# Patient Record
Sex: Female | Born: 1965 | Race: Black or African American | Hispanic: No | Marital: Married | Smoking: Never smoker
Health system: Southern US, Community
[De-identification: ages and names within clinical notes are randomized; demographics above are authoritative.]

## PROBLEM LIST (undated history)

## (undated) DIAGNOSIS — C801 Malignant (primary) neoplasm, unspecified: Secondary | ICD-10-CM

## (undated) DIAGNOSIS — J45909 Unspecified asthma, uncomplicated: Secondary | ICD-10-CM

## (undated) DIAGNOSIS — E079 Disorder of thyroid, unspecified: Secondary | ICD-10-CM

## (undated) DIAGNOSIS — K5090 Crohn's disease, unspecified, without complications: Secondary | ICD-10-CM

## (undated) DIAGNOSIS — R51 Headache: Secondary | ICD-10-CM

## (undated) DIAGNOSIS — D496 Neoplasm of unspecified behavior of brain: Secondary | ICD-10-CM

## (undated) DIAGNOSIS — D869 Sarcoidosis, unspecified: Secondary | ICD-10-CM

## (undated) DIAGNOSIS — R519 Headache, unspecified: Secondary | ICD-10-CM

## (undated) HISTORY — PX: LUNG BIOPSY: SHX232

## (undated) HISTORY — PX: THYROIDECTOMY: SHX17

## (undated) HISTORY — PX: CHOLECYSTECTOMY: SHX55

---

## 2016-09-16 ENCOUNTER — Encounter (HOSPITAL_COMMUNITY): Payer: Self-pay | Admitting: Emergency Medicine

## 2016-09-16 ENCOUNTER — Emergency Department (HOSPITAL_COMMUNITY): Payer: Medicare HMO

## 2016-09-16 DIAGNOSIS — G4489 Other headache syndrome: Secondary | ICD-10-CM | POA: Diagnosis not present

## 2016-09-16 DIAGNOSIS — J45909 Unspecified asthma, uncomplicated: Secondary | ICD-10-CM | POA: Insufficient documentation

## 2016-09-16 DIAGNOSIS — D649 Anemia, unspecified: Secondary | ICD-10-CM | POA: Diagnosis not present

## 2016-09-16 DIAGNOSIS — K5090 Crohn's disease, unspecified, without complications: Secondary | ICD-10-CM | POA: Diagnosis not present

## 2016-09-16 DIAGNOSIS — R51 Headache: Secondary | ICD-10-CM | POA: Diagnosis present

## 2016-09-16 DIAGNOSIS — Z9049 Acquired absence of other specified parts of digestive tract: Secondary | ICD-10-CM | POA: Insufficient documentation

## 2016-09-16 LAB — CBC
HCT: 34.1 % — ABNORMAL LOW (ref 36.0–46.0)
Hemoglobin: 10.8 g/dL — ABNORMAL LOW (ref 12.0–15.0)
MCH: 25.3 pg — AB (ref 26.0–34.0)
MCHC: 31.7 g/dL (ref 30.0–36.0)
MCV: 79.9 fL (ref 78.0–100.0)
PLATELETS: 279 10*3/uL (ref 150–400)
RBC: 4.27 MIL/uL (ref 3.87–5.11)
RDW: 15.8 % — AB (ref 11.5–15.5)
WBC: 6.8 10*3/uL (ref 4.0–10.5)

## 2016-09-16 LAB — URINALYSIS, ROUTINE W REFLEX MICROSCOPIC
Bilirubin Urine: NEGATIVE
Glucose, UA: NEGATIVE mg/dL
KETONES UR: NEGATIVE mg/dL
Leukocytes, UA: NEGATIVE
Nitrite: NEGATIVE
PH: 5 (ref 5.0–8.0)
PROTEIN: 100 mg/dL — AB
Specific Gravity, Urine: 1.025 (ref 1.005–1.030)
WBC, UA: NONE SEEN WBC/hpf (ref 0–5)

## 2016-09-16 LAB — BASIC METABOLIC PANEL
Anion gap: 9 (ref 5–15)
BUN: 8 mg/dL (ref 6–20)
CHLORIDE: 104 mmol/L (ref 101–111)
CO2: 23 mmol/L (ref 22–32)
CREATININE: 0.84 mg/dL (ref 0.44–1.00)
Calcium: 9.1 mg/dL (ref 8.9–10.3)
GFR calc Af Amer: >60 mL/min (ref >60)
GFR calc non Af Amer: >60 mL/min (ref >60)
Glucose, Bld: 120 mg/dL — ABNORMAL HIGH (ref 65–99)
Potassium: 3.6 mmol/L (ref 3.5–5.1)
Sodium: 136 mmol/L (ref 135–145)

## 2016-09-16 NOTE — ED Triage Notes (Addendum)
Pt transported from home by [facility], pt refused [facility]. Pt c/o HA, unclear when HA began.  Per EMS pt screaming nearly entire way to ED. IV est by EMS #20 AC. Pt states she moved to area from [place].  Pt states she has been to [facility] and does not want to back d/t lack of cleanliness  Pt states she has a brain tumor that does not show up on MRI or CT, only PET d/t location. Pt states last scan 1 yr ago in [place] Pt speaking in a very child like tone, pt reports HA x 2 hours, pt states she is out of meds, thyroid meds and HA pills. Pt falling asleep during triage conversation, pt states she is tired after moving into new apartment today.

## 2016-09-17 ENCOUNTER — Emergency Department (HOSPITAL_COMMUNITY)
Admission: EM | Admit: 2016-09-17 | Discharge: 2016-09-17 | Disposition: A | Discharge status: Home / Self Care | Payer: Medicare HMO | Attending: Emergency Medicine | Admitting: Emergency Medicine

## 2016-09-17 DIAGNOSIS — D649 Anemia, unspecified: Secondary | ICD-10-CM

## 2016-09-17 DIAGNOSIS — G4489 Other headache syndrome: Secondary | ICD-10-CM

## 2016-09-17 HISTORY — DX: Neoplasm of unspecified behavior of brain: D49.6

## 2016-09-17 HISTORY — DX: Disorder of thyroid, unspecified: E07.9

## 2016-09-17 HISTORY — DX: Unspecified asthma, uncomplicated: J45.909

## 2016-09-17 HISTORY — DX: Headache, unspecified: R51.9

## 2016-09-17 HISTORY — DX: Crohn's disease, unspecified, without complications: K50.90

## 2016-09-17 HISTORY — DX: Headache: R51

## 2016-09-17 HISTORY — DX: Sarcoidosis, unspecified: D86.9

## 2016-09-17 MED ORDER — LORAZEPAM 1 MG PO TABS
1.0000 mg | ORAL_TABLET | Freq: Once | ORAL | Status: AC
  Administered 2016-09-17: 1 mg via ORAL
  Filled 2016-09-17: qty 1

## 2016-09-17 MED ORDER — LORAZEPAM 1 MG PO TABS: 1.0000 mg | ORAL_TABLET | Freq: Three times a day (TID) | ORAL | 0 refills | Status: AC | PRN

## 2016-09-17 NOTE — ED Provider Notes (Signed)
[facility] DEPT Provider Note   CSN: [id number] Arrival date & time: 09/16/16  2053     History   Chief Complaint Chief Complaint  Patient presents with  . Headache    HPI Claudia Torres is a 51 y.o. female.  She presents for evaluation of ongoing headache for 4 months that she thinks is related to living in an apartment "with mold."  She moved out of the apartment today.  She complains of ongoing rhinorrhea, but denies fever, chills, nausea, persistent vomiting, chest pain, productive cough.  She did vomit once tonight.  She occasionally coughs but does not produce sputum.  She has previously taken Klonopin, but ran out of it 3 weeks ago.  She is relatively new to [facility] and has not established primary care doctor yet.  She feels like the home with mold caused her teeth to "fall out and a bald patch on my head."  There are no other known modifying factors.    HPI  Past Medical History:  Diagnosis Date  . Asthma   . Brain tumor ([place])   . Crohn disease ([place])   . Headache   . Sarcoidosis   . Thyroid disease     There are no active problems to display for this patient.   Past Surgical History:  Procedure Laterality Date  . CHOLECYSTECTOMY    . LUNG BIOPSY    . THYROIDECTOMY      OB History    No data available       Home Medications    Prior to Admission medications   Not on File    Family History No family history on file.  Social History Social History  Substance Use Topics  . Smoking status: Never Smoker  . Smokeless tobacco: Never Used  . Alcohol use No     Allergies   Penicillins and Reglan [metoclopramide]   Review of Systems Review of Systems  All other systems reviewed and are negative.    Physical Exam Updated Vital Signs BP 117/84   Pulse 79   Temp 99 F (37.2 C) (Oral)   Resp 16   Ht 5\' 7"  (1.702 m)   Wt 90.7 kg (200 lb)   LMP 09/15/2016   SpO2 98%   BMI 31.32 kg/m   Physical Exam  Constitutional: She is  oriented to person, place, and time. She appears well-developed and well-nourished. No distress.  HENT:  Head: Normocephalic and atraumatic.  Poor dentition with multiple caries  Eyes: Pupils are equal, round, and reactive to light. Conjunctivae and EOM are normal.  Neck: Normal range of motion and phonation normal. Neck supple.  No meningismus  Cardiovascular: Normal rate and regular rhythm.   Pulmonary/Chest: Effort normal and breath sounds normal. She exhibits no tenderness.  Musculoskeletal: Normal range of motion.  Mild tenderness of the posterior cervical and upper trapezius muscles bilaterally.  Neurological: She is alert and oriented to person, place, and time. She exhibits normal muscle tone.  Skin: Skin is warm and dry.  Psychiatric: Her behavior is normal. Judgment and thought content normal.  Mildly anxious  Nursing note and vitals reviewed.    ED Treatments / Results  Labs (all labs ordered are listed, but only abnormal results are displayed) Labs Reviewed  BASIC METABOLIC PANEL - Abnormal; Notable for the following:       Result Value   Glucose, Bld 120 (*)    All other components within normal limits  CBC - Abnormal; Notable for the following:  Hemoglobin 10.8 (*)    HCT 34.1 (*)    MCH 25.3 (*)    RDW 15.8 (*)    All other components within normal limits  URINALYSIS, ROUTINE W REFLEX MICROSCOPIC - Abnormal; Notable for the following:    APPearance HAZY (*)    Hgb urine dipstick LARGE (*)    Protein, ur 100 (*)    Bacteria, UA RARE (*)    Squamous Epithelial / LPF 0-5 (*)    All other components within normal limits  CBG MONITORING, ED    EKG  EKG Interpretation  Date/Time:  Friday September 16 2016 21:13:03 EDT Ventricular Rate:  89 PR Interval:  156 QRS Duration: 84 QT Interval:  368 QTC Calculation: 447 R Axis:   79 Text Interpretation:  Normal sinus rhythm Normal ECG No old tracing to compare Confirmed by Daleen Bo [phone]) on 09/17/2016  1:23:47 AM       Radiology Ct Head Wo Contrast  Result Date: 09/16/2016 CLINICAL DATA:  Headache, altered level of consciousness EXAM: CT HEAD WITHOUT CONTRAST TECHNIQUE: Contiguous axial images were obtained from the base of the skull through the vertex without intravenous contrast. COMPARISON:  None. FINDINGS: Brain: No evidence of acute infarction, hemorrhage, hydrocephalus, extra-axial collection or mass lesion/mass effect. Vascular: No hyperdense vessel or unexpected calcification. Skull: Normal. Negative for fracture or focal lesion. Sinuses/Orbits: Mild mucosal thickening in the ethmoid sinuses. No acute orbital abnormality. Other: None IMPRESSION: No CT evidence for acute intracranial abnormality. Electronically Signed   By: Donavan Foil M.D.   On: 09/16/2016 21:39    Procedures Procedures (including critical care time)  Medications Ordered in ED Medications - No data to display   Initial Impression / Assessment and Plan / ED Course  I have reviewed the triage vital signs and the nursing notes.  Pertinent labs & imaging results that were available during my care of the patient were reviewed by me and considered in my medical decision making (see chart for details).      Patient Vitals for the past 24 hrs:  BP Temp Temp src Pulse Resp SpO2 Height Weight  09/16/16 2348 117/84 - - 79 16 98 % - -  09/16/16 2103 - - - - - - 5\' 7"  (1.702 m) 90.7 kg (200 lb)  09/16/16 2059 111/81 99 F (37.2 C) Oral 99 16 100 % - -  09/16/16 2053 - - - - - 96 % - -    1:35 AM Reevaluation with update and discussion. After initial assessment and treatment, an updated evaluation reveals no change in clinical status.  Findings discussed with patient, all questions answered. [name]      Final Clinical Impressions(s) / ED Diagnoses   Final diagnoses:  Other headache syndrome  Anemia, unspecified type    Nonspecific headache, likely muscle tension related.  Patient concern for  "mild illness."  No sign of acute pulmonary, intracranial, or metabolic instability.  Nursing Notes Reviewed/ Care Coordinated Applicable Imaging Reviewed Interpretation of Laboratory Data incorporated into ED treatment  The patient appears reasonably screened and/or stabilized for discharge and I doubt any other medical condition or other [facility] requiring further screening, evaluation, or treatment in the ED at this time prior to discharge.  Plan: Home Medications-OTC analgesia as needed; Home Treatments-rest, fluids; return here if the recommended treatment, does not improve the symptoms; Recommended follow up-follow-up with PCP and dentist of choice as soon as possible.   New Prescriptions New Prescriptions   No medications on  file     Daleen Bo, MD 09/17/16 [phone]

## 2016-09-17 NOTE — Discharge Instructions (Signed)
Make sure you are drinking plenty of water.  Follow-up with a primary care doctor, and Claudia Torres, as soon as possible for ongoing care.  Try using heat on the sore area of your neck, to help the discomfort.  Your hemoglobin was low today, 10.8.  Follow-up with a primary care doctor about it in 1 or 2 weeks.

## 2016-11-23 ENCOUNTER — Emergency Department (HOSPITAL_COMMUNITY)
Admission: EM | Admit: 2016-11-23 | Discharge: 2016-11-23 | Disposition: A | Discharge status: Home / Self Care | Payer: Medicare HMO | Attending: Emergency Medicine | Admitting: Emergency Medicine

## 2016-11-23 ENCOUNTER — Encounter (HOSPITAL_COMMUNITY): Payer: Self-pay | Admitting: Emergency Medicine

## 2016-11-23 ENCOUNTER — Emergency Department (HOSPITAL_COMMUNITY): Payer: Medicare HMO

## 2016-11-23 ENCOUNTER — Other Ambulatory Visit: Payer: Self-pay

## 2016-11-23 DIAGNOSIS — J45901 Unspecified asthma with (acute) exacerbation: Secondary | ICD-10-CM | POA: Insufficient documentation

## 2016-11-23 DIAGNOSIS — Z9049 Acquired absence of other specified parts of digestive tract: Secondary | ICD-10-CM | POA: Diagnosis not present

## 2016-11-23 DIAGNOSIS — R05 Cough: Secondary | ICD-10-CM

## 2016-11-23 DIAGNOSIS — Z79899 Other long term (current) drug therapy: Secondary | ICD-10-CM | POA: Insufficient documentation

## 2016-11-23 DIAGNOSIS — R059 Cough, unspecified: Secondary | ICD-10-CM

## 2016-11-23 HISTORY — DX: Malignant (primary) neoplasm, unspecified: C80.1

## 2016-11-23 LAB — I-STAT BETA HCG BLOOD, ED (MC, WL, AP ONLY)

## 2016-11-23 LAB — COMPREHENSIVE METABOLIC PANEL
ALK PHOS: 106 U/L (ref 38–126)
ALT: 8 U/L — AB (ref 14–54)
AST: 12 U/L — AB (ref 15–41)
Albumin: 2.9 g/dL — ABNORMAL LOW (ref 3.5–5.0)
Anion gap: 7 (ref 5–15)
BILIRUBIN TOTAL: 0.5 mg/dL (ref 0.3–1.2)
BUN: 8 mg/dL (ref 6–20)
CALCIUM: 9 mg/dL (ref 8.9–10.3)
CO2: 23 mmol/L (ref 22–32)
CREATININE: 0.82 mg/dL (ref 0.44–1.00)
Chloride: 107 mmol/L (ref 101–111)
GFR calc Af Amer: >60 mL/min (ref >60)
GFR calc non Af Amer: >60 mL/min (ref >60)
Glucose, Bld: 131 mg/dL — ABNORMAL HIGH (ref 65–99)
POTASSIUM: 3.4 mmol/L — AB (ref 3.5–5.1)
Sodium: 137 mmol/L (ref 135–145)
TOTAL PROTEIN: 6.8 g/dL (ref 6.5–8.1)

## 2016-11-23 LAB — CBC WITH DIFFERENTIAL/PLATELET
BASOS ABS: 0 10*3/uL (ref 0.0–0.1)
BASOS PCT: 1 %
EOS ABS: 0.8 10*3/uL — AB (ref 0.0–0.7)
EOS PCT: 12 %
HCT: 33.2 % — ABNORMAL LOW (ref 36.0–46.0)
Hemoglobin: 10.5 g/dL — ABNORMAL LOW (ref 12.0–15.0)
Lymphocytes Relative: 32 %
Lymphs Abs: 2 10*3/uL (ref 0.7–4.0)
MCH: 25.2 pg — ABNORMAL LOW (ref 26.0–34.0)
MCHC: 31.6 g/dL (ref 30.0–36.0)
MCV: 79.8 fL (ref 78.0–100.0)
MONO ABS: 0.3 10*3/uL (ref 0.1–1.0)
Monocytes Relative: 6 %
Neutro Abs: 3.1 10*3/uL (ref 1.7–7.7)
Neutrophils Relative %: 49 %
PLATELETS: 310 10*3/uL (ref 150–400)
RBC: 4.16 MIL/uL (ref 3.87–5.11)
RDW: 16.8 % — AB (ref 11.5–15.5)
WBC: 6.2 10*3/uL (ref 4.0–10.5)

## 2016-11-23 LAB — I-STAT TROPONIN, ED
TROPONIN I, POC: 0 ng/mL (ref 0.00–0.08)
Troponin i, poc: 0 ng/mL (ref 0.00–0.08)

## 2016-11-23 LAB — I-STAT CG4 LACTIC ACID, ED
LACTIC ACID, VENOUS: 1.06 mmol/L (ref 0.5–1.9)
Lactic Acid, Venous: 1.29 mmol/L (ref 0.5–1.9)

## 2016-11-23 MED ORDER — IOPAMIDOL (ISOVUE-370) INJECTION 76%
INTRAVENOUS | Status: AC
  Administered 2016-11-23: 100 mL
  Filled 2016-11-23: qty 100

## 2016-11-23 MED ORDER — PREDNISONE 50 MG PO TABS: 50.0000 mg | ORAL_TABLET | Freq: Every day | ORAL | 0 refills | Status: AC

## 2016-11-23 MED ORDER — METHYLPREDNISOLONE SODIUM SUCC 125 MG IJ SOLR
125.0000 mg | Freq: Once | INTRAMUSCULAR | Status: AC
  Administered 2016-11-23: 125 mg via INTRAVENOUS
  Filled 2016-11-23: qty 2

## 2016-11-23 MED ORDER — HYDROXYZINE HCL 25 MG PO TABS: 25.0000 mg | ORAL_TABLET | Freq: Three times a day (TID) | ORAL | 0 refills | Status: AC | PRN

## 2016-11-23 MED ORDER — ALBUTEROL SULFATE (2.5 MG/3ML) 0.083% IN NEBU
5.0000 mg | INHALATION_SOLUTION | Freq: Once | RESPIRATORY_TRACT | Status: AC
  Administered 2016-11-23: 5 mg via RESPIRATORY_TRACT
  Filled 2016-11-23: qty 6

## 2016-11-23 NOTE — ED Triage Notes (Signed)
Pt to ED for recurring cough, SOB x 1 month. Pt reports taking a course of antibiotics in the last month following dental work, but was not put on antibiotics for current symptoms. Pt has tried inhaler, OTC meds with some relief, but she states the cough comes right back. Her husband at bedside reports she has become increasingly weak over the last week. She reports fevers off and on. Pt has hx of brain cancer, sts she hasn't seen her doctor in a while, states "I think I have lung cancer." Pt saw someone last week, who told her they saw "lines on her lungs, but could have been related to surgery in the past." Bilateral wheezing noted. Pt ambulatory with steady gait. Resp e/u, skin warm/dry at this time.

## 2016-11-23 NOTE — ED Notes (Signed)
Patient transported to CT 

## 2016-11-23 NOTE — Discharge Instructions (Signed)
Your workup today showed no evidence of pneumonia or blood clot causing your cough and shortness of breath.  We suspect to have an asthma exacerbation causing her symptoms and wheezing.  Please take the steroids for the next few days.  Please continue your home breathing treatments.  Please follow-up with a primary care physician for further med if any symptoms change or worsen, please return to the nearest emergency department.

## 2016-11-23 NOTE — ED Provider Notes (Signed)
[facility] EMERGENCY DEPARTMENT Provider Note   CSN: [id number] Arrival date & time: 11/23/16  1130     History   Chief Complaint Chief Complaint  Patient presents with  . Cough  . Fatigue    HPI Tynlee Bayle is a 51 y.o. female.  The history is provided by the patient and medical records.  Cough  This is a new problem. The current episode started more than 1 week ago. The problem occurs constantly. The problem has not changed since onset.The cough is productive of blood-tinged sputum. Maximum temperature: subejctive fevers. Associated symptoms include chest pain, chills, rhinorrhea, shortness of breath and wheezing. Pertinent negatives include no sweats and no headaches. She has tried nothing for the symptoms. The treatment provided no relief. She is not a smoker. Her past medical history is significant for asthma.    Past Medical History:  Diagnosis Date  . Asthma   . Brain tumor ([place])   . Crohn disease ([place])   . Headache   . Sarcoidosis   . Thyroid disease     There are no active problems to display for this patient.   Past Surgical History:  Procedure Laterality Date  . CHOLECYSTECTOMY    . LUNG BIOPSY    . THYROIDECTOMY      OB History    No data available       Home Medications    Prior to Admission medications   Medication Sig Start Date End Date Taking? Authorizing Provider  albuterol (PROVENTIL HFA;VENTOLIN HFA) 108 (90 Base) MCG/ACT inhaler Inhale 1-2 puffs every 6 (six) hours as needed into the lungs for wheezing.  10/26/16  Yes [provider]  albuterol (PROVENTIL) (2.5 MG/3ML) 0.083% nebulizer solution Inhale 3 mLs every 6 (six) hours as needed into the lungs. 08/25/16  Yes [provider]  Fluticasone-Salmeterol (ADVAIR DISKUS) 250-50 MCG/DOSE AEPB Inhale 1 puff 2 (two) times daily into the lungs. 11/17/16  Yes [provider]  levothyroxine (SYNTHROID, LEVOTHROID) 125 MCG tablet Take 125 mcg daily  by mouth. 11/12/16  Yes [provider]  LORazepam (ATIVAN) 1 MG tablet Take 1 tablet (1 mg total) by mouth every 8 (eight) hours as needed (Muscle tension). 09/17/16   Daleen Bo, MD    Family History No family history on file.  Social History Social History   Tobacco Use  . Smoking status: Never Smoker  . Smokeless tobacco: Never Used  Substance Use Topics  . Alcohol use: No  . Drug use: No     Allergies   Eggs or egg-derived products; Other; Orange fruit [citrus]; Penicillins; Reglan [metoclopramide]; and Tomato   Review of Systems Review of Systems  Constitutional: Positive for chills, fatigue and fever. Negative for diaphoresis.  HENT: Positive for congestion and rhinorrhea.   Eyes: Negative for visual disturbance.  Respiratory: Positive for cough, chest tightness, shortness of breath and wheezing.   Cardiovascular: Positive for chest pain.  Gastrointestinal: Negative for abdominal pain, constipation, diarrhea, nausea and vomiting.  Genitourinary: Negative for decreased urine volume, dysuria, flank pain and frequency.  Musculoskeletal: Negative for back pain and neck stiffness.  Skin: Negative for rash and wound.  Neurological: Negative for light-headedness, numbness and headaches.  Psychiatric/Behavioral: Negative for agitation and confusion.  All other systems reviewed and are negative.    Physical Exam Updated Vital Signs BP (!) 132/93   Pulse 76   Temp 98 F (36.7 C) (Oral)   Resp 18   Ht 5\' 7"  (1.702 m)  Wt 79.4 kg (175 lb)   LMP 11/02/2016   SpO2 99%   BMI 27.41 kg/m   Physical Exam  Constitutional: She appears well-developed and well-nourished. No distress.  HENT:  Head: Normocephalic and atraumatic.  Mouth/Throat: Oropharynx is clear and moist. No oropharyngeal exudate.  Eyes: Conjunctivae and EOM are normal. Pupils are equal, round, and reactive to light.  Neck: Neck supple. No JVD present.  Cardiovascular: Normal rate, regular  rhythm and intact distal pulses.  No murmur heard. Pulmonary/Chest: Effort normal. Tachypnea noted. No respiratory distress. She has wheezes. She has no rhonchi. She has no rales. She exhibits tenderness.  Abdominal: Soft. There is no tenderness.  Musculoskeletal: She exhibits no edema or tenderness.  Neurological: She is alert. No sensory deficit. She exhibits normal muscle tone.  Skin: Skin is warm and dry. Capillary refill takes less than 2 seconds. No rash noted. She is not diaphoretic. No erythema.  Psychiatric: She has a normal mood and affect.  Nursing note and vitals reviewed.    ED Treatments / Results  Labs (all labs ordered are listed, but only abnormal results are displayed) Labs Reviewed  COMPREHENSIVE METABOLIC PANEL - Abnormal; Notable for the following components:      Result Value   Potassium 3.4 (*)    Glucose, Bld 131 (*)    Albumin 2.9 (*)    AST 12 (*)    ALT 8 (*)    All other components within normal limits  CBC WITH DIFFERENTIAL/PLATELET - Abnormal; Notable for the following components:   Hemoglobin 10.5 (*)    HCT 33.2 (*)    MCH 25.2 (*)    RDW 16.8 (*)    Eosinophils Absolute 0.8 (*)    All other components within normal limits  I-STAT CG4 LACTIC ACID, ED  I-STAT BETA HCG BLOOD, ED ([facility], [facility], AP ONLY)  I-STAT CG4 LACTIC ACID, ED  I-STAT TROPONIN, ED  I-STAT TROPONIN, ED    EKG  EKG Interpretation  Date/Time:  Wednesday November 23 2016 11:56:35 EST Ventricular Rate:  93 PR Interval:  140 QRS Duration: 84 QT Interval:  378 QTC Calculation: 469 R Axis:   69 Text Interpretation:  Normal sinus rhythm Normal ECG Confirmed by Quintella Reichert [phone]) on 11/23/2016 12:05:53 PM       Radiology Dg Chest 2 View  Result Date: 11/23/2016 CLINICAL DATA:  Chest pain x1 month, cough, shortness of breath EXAM: CHEST  2 VIEW COMPARISON:  12/03/2016 FINDINGS: Left upper lobe/ perihilar scarring. 3 mm right lower lobe granuloma. No focal consolidation. No  pleural effusion or pneumothorax. The heart normal size. Visualized osseous structures are within normal limits. Cholecystectomy clips. IMPRESSION: No evidence of acute cardiopulmonary disease. Electronically Signed   By: Julian Hy M.D.   On: 11/23/2016 12:18   Ct Angio Chest Pe W And/or Wo Contrast  Result Date: 11/23/2016 CLINICAL DATA:  Chest pain, cough for 2 weeks. History of sarcoidosis, brain tumor, lung biopsy, thyroidectomy. EXAM: CT ANGIOGRAPHY CHEST WITH CONTRAST TECHNIQUE: Multidetector CT imaging of the chest was performed using the standard protocol during bolus administration of intravenous contrast. Multiplanar CT image reconstructions and MIPs were obtained to evaluate the vascular anatomy. CONTRAST:  61.5 cc Isovue 370 COMPARISON:  Chest radiograph November 23, 2016 at 1216 hours FINDINGS: CARDIOVASCULAR: Adequate contrast opacification of the pulmonary artery's. Main pulmonary artery is not enlarged. No pulmonary arterial filling defects to the level of the segmental branches. Mild respiratory motion, limits sensitivity for subsegmental emboli. Heart size is  normal, no right heart strain. No pericardial effusion. Thoracic aorta is normal course and caliber, mild calcific atherosclerosis aortic arch. MEDIASTINUM/NODES: 10 mm short axis aortopulmonary window lymph node. 14 mm subcarinal lymph node. Anterior mediastinal fat stranding most compatible with thymic reactivation. LUNGS/PLEURA: Tracheobronchial tree is patent, no pneumothorax. Mild bronchial wall thickening. LEFT upper lobe nodular scarring with bronchiectasis, 4 mm dense versus enhancing nodular component. RIGHT lung base atelectasis or scarring. UPPER ABDOMEN: Nonacute.  Status post cholecystectomy. MUSCULOSKELETAL: Visualized soft tissues and included osseous structures appear normal. Review of the MIP images confirms the above findings. IMPRESSION: 1. No acute pulmonary embolism. 2. Bronchial wall thickening seen with  bronchitis or reactive airway disease without focal consolidation. 3. LEFT upper lobe scarring suggests post infectious in etiology. 4. Mild mediastinal lymphadenopathy, associated with sarcoidosis. Electronically Signed   By: Elon Alas M.D.   On: 11/23/2016 19:24    Procedures Procedures (including critical care time)  Medications Ordered in ED Medications  albuterol (PROVENTIL) (2.5 MG/3ML) 0.083% nebulizer solution 5 mg (5 mg Nebulization Given 11/23/16 1653)  iopamidol (ISOVUE-370) 76 % injection (100 mLs  Contrast Given 11/23/16 1838)  methylPREDNISolone sodium succinate (SOLU-MEDROL) 125 mg/2 mL injection 125 mg (125 mg Intravenous Given 11/23/16 2135)     Initial Impression / Assessment and Plan / ED Course  I have reviewed the triage vital signs and the nursing notes.  Pertinent labs & imaging results that were available during my care of the patient were reviewed by me and considered in my medical decision making (see chart for details).     Paulena Servais is a 51 y.o. female with a past medical history significant for asthma, thyroid disease, sarcoidosis, Crohn's disease, and prior brain tumor who presents with hemoptysis, shortness of breath, wheezing, congestion, productive cough, sharp chest pain, and fatigue.  Patient reports that since she moved to [place] several months ago she has been having worsening asthma.  She says that she had a worsening cough with chills for the last month.  One month ago she says that she was started on steroids and azithromycin which did not clear up her symptoms.  She says that she has continued symptoms that has progressed to occasional hemoptysis.  She says that she is wheezing all the time that her albuterol is not helping at home.  She reports that she has sharp chest pain in her central chest when she has coughing fits.  She denies leg pain or leg swelling.  She denies history of blood clots.  She denies any nausea, vomiting,  palpitations, syncope.  She denies any urinary symptoms or GI symptoms.  Patient denies any trauma.  On exam, patient has wheezing all albuterol nebulizer treatment.  Patient had no significant leg swelling or pain.  No leg tenderness.  Normal pulses in upper extremities.  Chest was mildly tender in the sternum.  Abdomen was nontender.  Back nontender.  Based on patient's symptoms, suspect asthma exacerbation in the setting of irritants.  Patient reports that she was exposed to mold at her previous residence and since moving the asthma has gotten slightly better.  She is still worried about infection.  Patient's other primary concern today is for lung cancer.  Given the coughing of blood, this is her biggest concern today.  Given the hemoptysis, occasional chest pain, shortness of breath in the setting of wheezing, CT PE study will be ordered to evaluate for pulmonary embolism as well as evaluate for possible malignancies.    Initial  EKG showed no STEMI.  Patient will have screening laboratory testing.  Heart score calculated as a 1.  Anticipate reassessment after workup.  9:21 PM Diagnostic testing results and above.  Patient had negative troponin x2.  Given the low heart score, patient is felt low risk for major adverse cardiac event in the next 30 days.  Lactic acid negative x2.  Laboratory testing otherwise reassuring.  PET scan showed no evidence of pulmonary embolism.  Patient has evidence of scar in the lungs which she reports is from prior pneumonias.  No evidence of cancer but sarcoidosis was seen.  Patient had improvement in her wheezing after the breathing treatment.  Suspect patient has an asthma exacerbation leading to her symptoms.  Patient will be given Solu-Medrol as well as a prescription for several days of a steroid.  Patient reports that she gets tingling in her lips occasionally and was asking for prescription of Atarax.  This was ordered.  Patient will follow-up with her  primary care physician in the next few days as well as good return precautions for any new or worsening symptoms.  Patient reports that she is moving to [place] to get away from the cold.  Patient understood return precautions and had no other questions or concerns.  Patient discharged in good condition.   Final Clinical Impressions(s) / ED Diagnoses   Final diagnoses:  Moderate asthma with exacerbation, unspecified whether persistent  Cough    ED Discharge Orders        Ordered    hydrOXYzine (ATARAX/VISTARIL) 25 MG tablet  Every 8 hours PRN     11/23/16 2116    predniSONE (DELTASONE) 50 MG tablet  Daily     11/23/16 2116      Clinical Impression: 1. Moderate asthma with exacerbation, unspecified whether persistent   2. Cough     Disposition: Discharge  Condition: Good  I have discussed the results, Dx and Tx plan with the pt(& family if present). He/she/they expressed understanding and agree(s) with the plan. Discharge instructions discussed at great length. Strict return precautions discussed and pt &/or family have verbalized understanding of the instructions. No further questions at time of discharge.    This SmartLink is deprecated. Use AVSMEDLIST instead to display the medication list for a patient.  Follow Up: [place] [id number] [phone] Schedule an appointment as soon as possible for a visit    [place] [address] [id number] [place] [phone]  If symptoms worsen     Tomiko Schoon, Gwenyth Allegra, MD 11/23/16 2314

## 2016-11-23 NOTE — ED Notes (Signed)
ED Provider at bedside. 

## 2018-08-08 IMAGING — DX DG CHEST 2V
2 series · 2 of 2 positions shown · non-contrast
Comparison: 12/03/2016

CLINICAL DATA: Chest pain x1 month, cough, shortness of breath

EXAM:
CHEST  2 VIEW

[w chest lat]
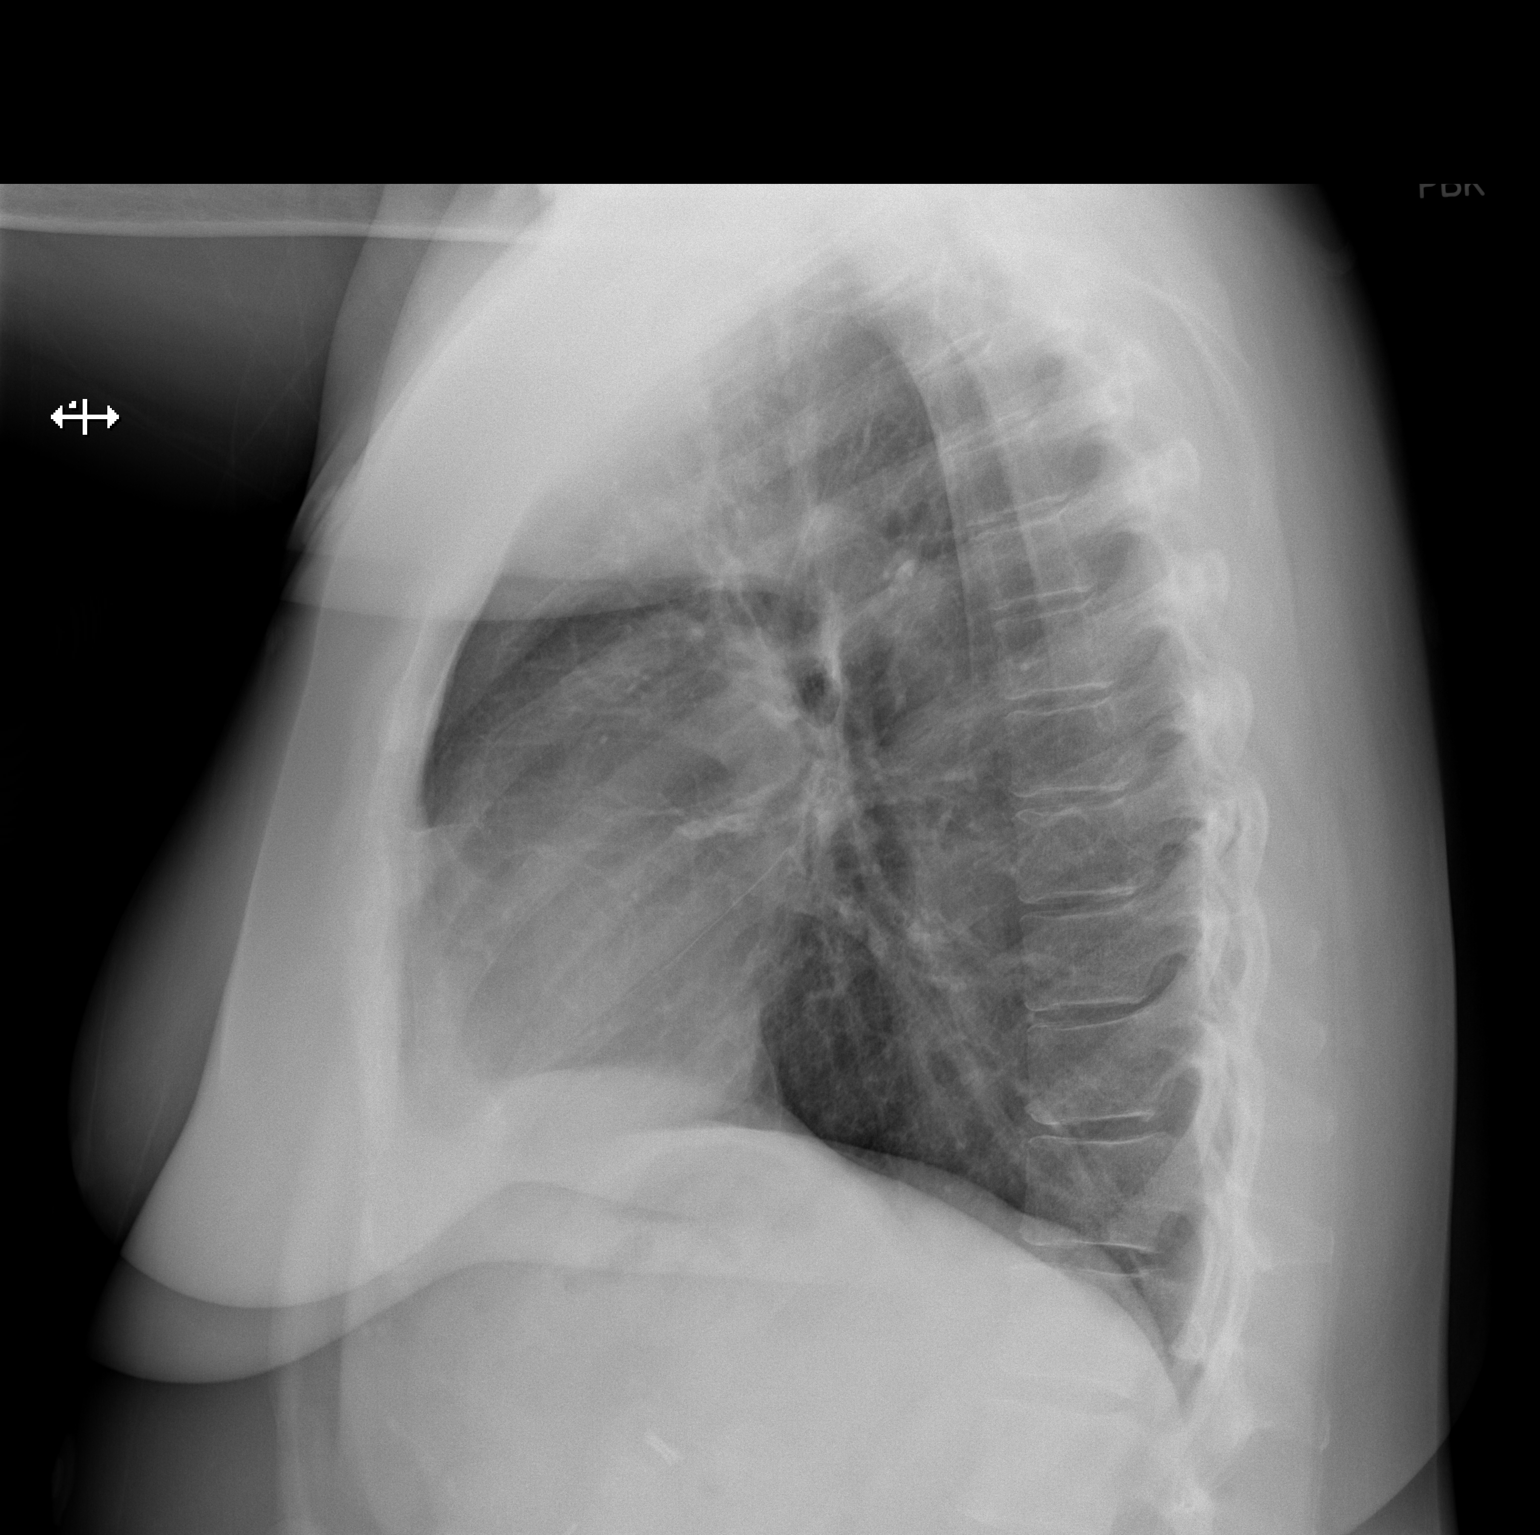

[w chest pa]
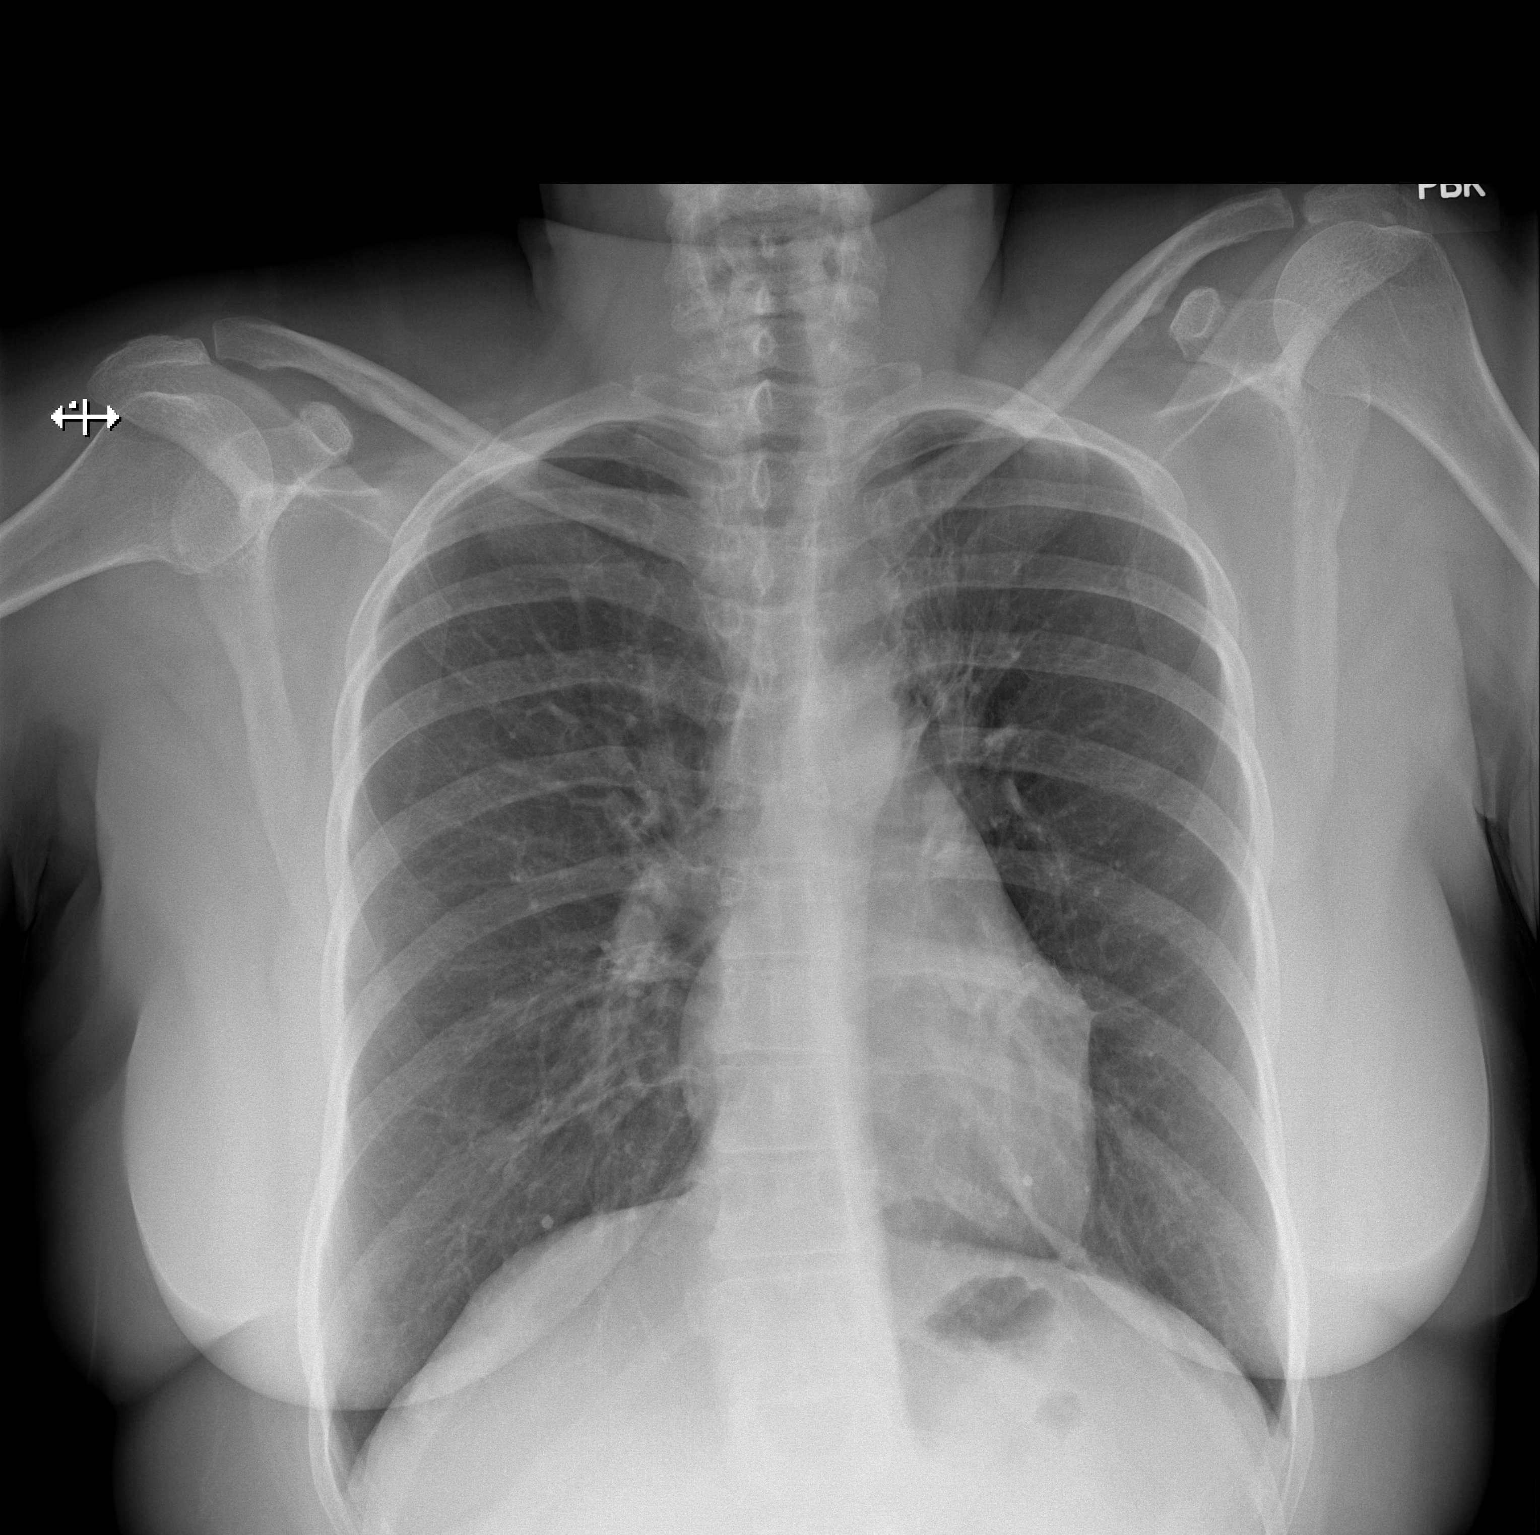

[2 of 2 positions shown; findings below may reference images not displayed]

FINDINGS: Left upper lobe/ perihilar scarring. 3 mm right lower lobe
granuloma. No focal consolidation. No pleural effusion or
pneumothorax.

The heart normal size.

Visualized osseous structures are within normal limits.

Cholecystectomy clips.
IMPRESSION: No evidence of acute cardiopulmonary disease.

## 2018-08-08 IMAGING — CT CT ANGIO CHEST
2 of 7 series · 18 of 46 positions shown · IV contrast (APPLIED)
Comparison: Chest radiograph November 23, 2016 at 4046 hours

CLINICAL DATA: Chest pain, cough for 2 weeks. History of
sarcoidosis, brain tumor, lung biopsy, thyroidectomy.

EXAM:
CT ANGIOGRAPHY CHEST WITH CONTRAST
TECHNIQUE: Multidetector CT imaging of the chest was performed using the
standard protocol during bolus administration of intravenous
contrast. Multiplanar CT image reconstructions and MIPs were
obtained to evaluate the vascular anatomy.
CONTRAST:  61.5 cc Isovue 370

[Series 8: thins · axial · 0.63mm/px · z∈[-219,+19]mm · 15 of 383 slices shown]
[im 22/383  lung]
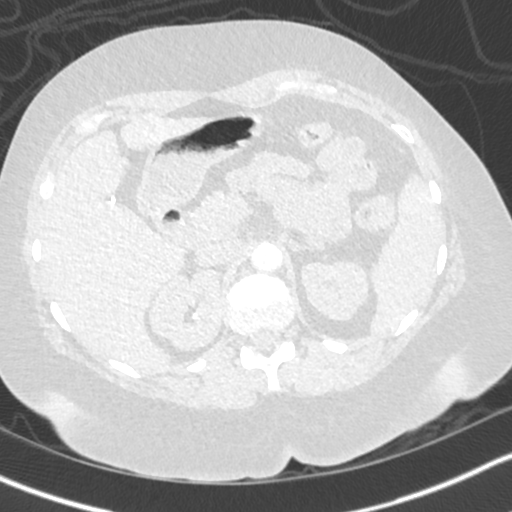
[im 43/383  soft-tissue]
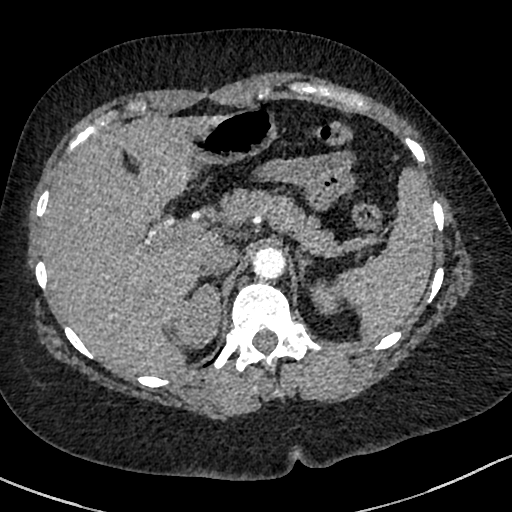
[im 64/383  lung]
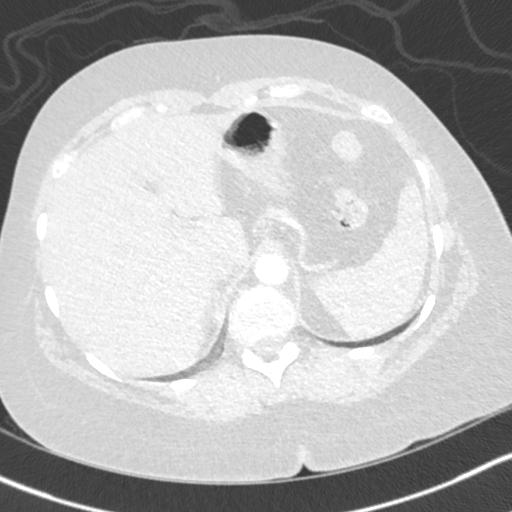
[im 85/383  soft-tissue]
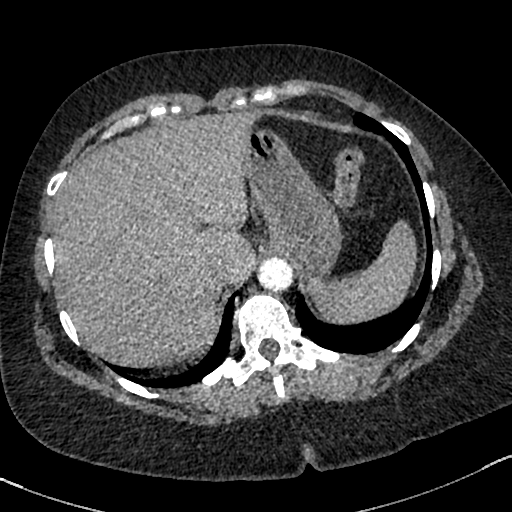
[im 128/383  lung]
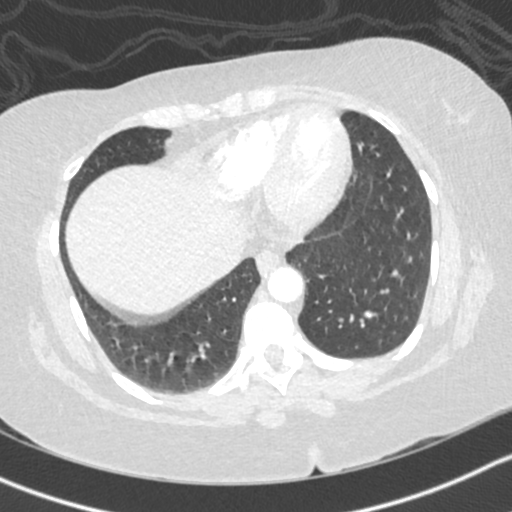
[im 149/383  soft-tissue]
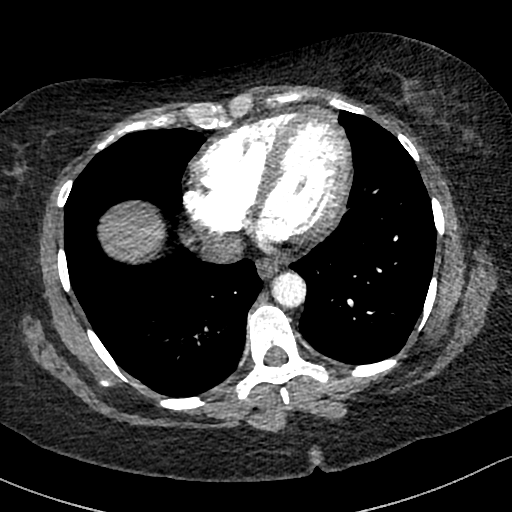
[im 170/383  lung]
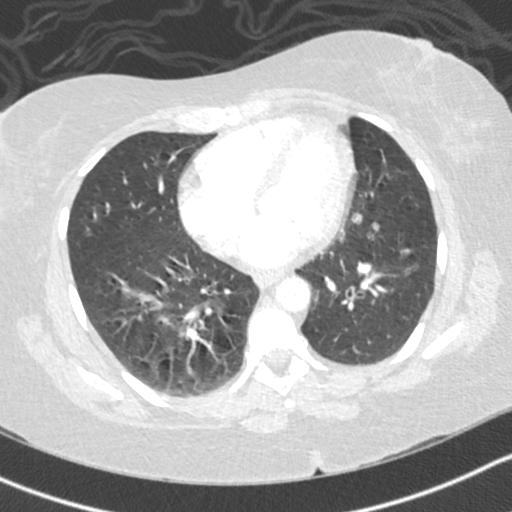
[im 192/383  soft-tissue]
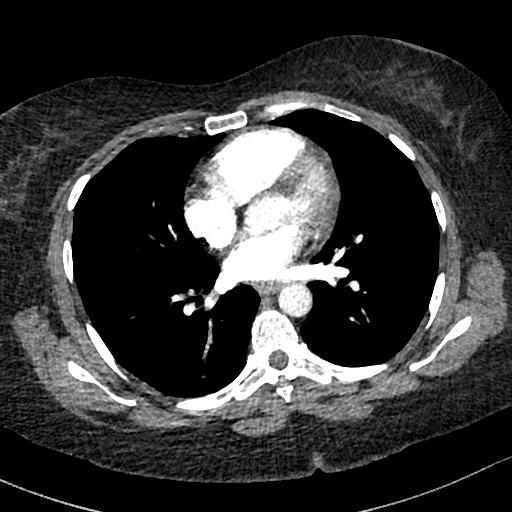
[im 213/383  lung]
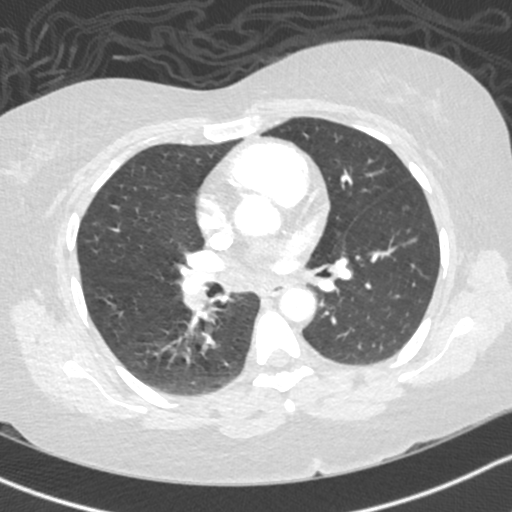
[im 234/383  soft-tissue]
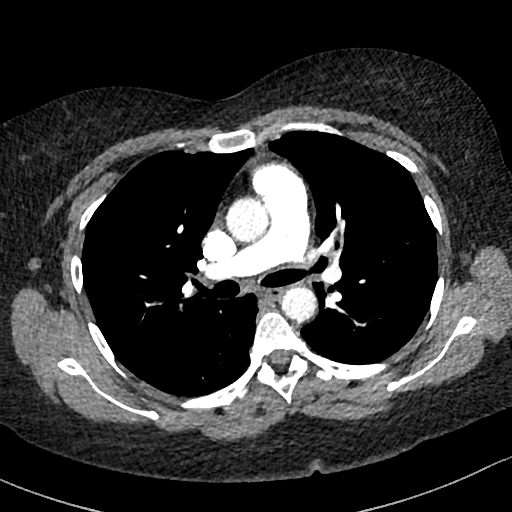
[im 255/383  lung]
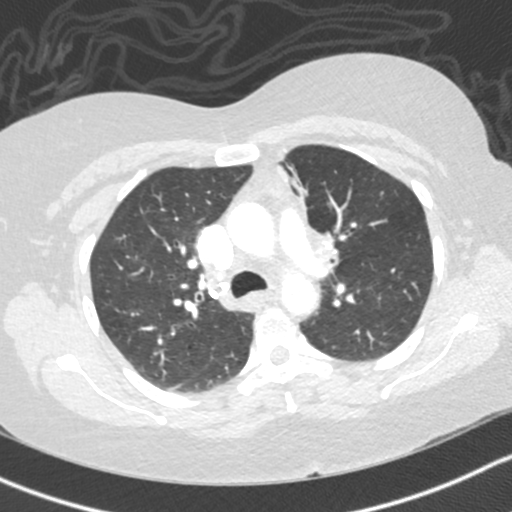
[im 298/383  soft-tissue]
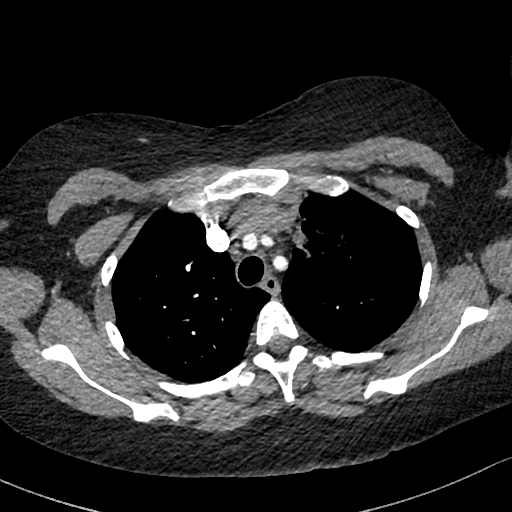
[im 319/383  lung]
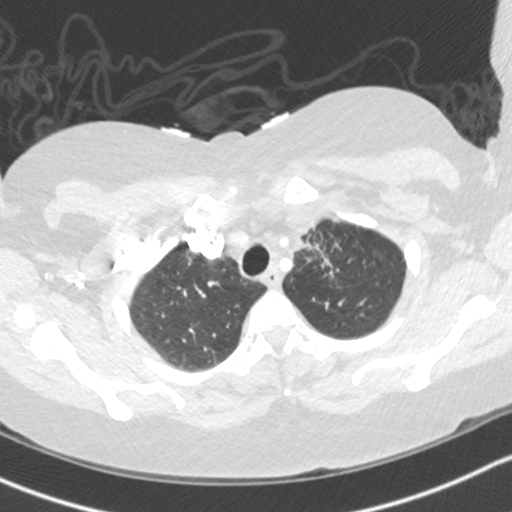
[im 340/383  soft-tissue]
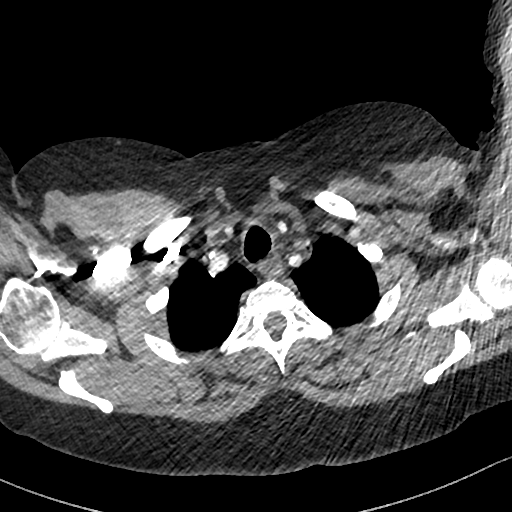
[im 361/383  lung]
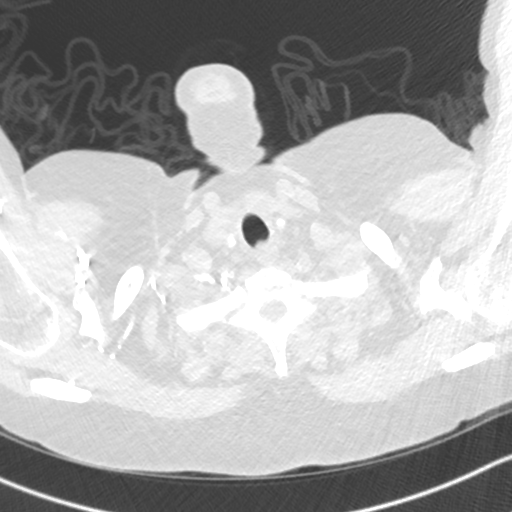

[Series 9: cor · coronal · 0.53mm/px · 3 of 121 slices shown]
[im 31/121  soft-tissue]
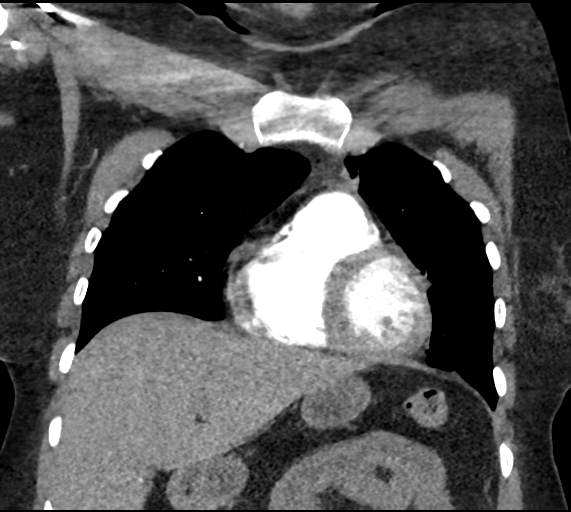
[im 61/121  soft-tissue]
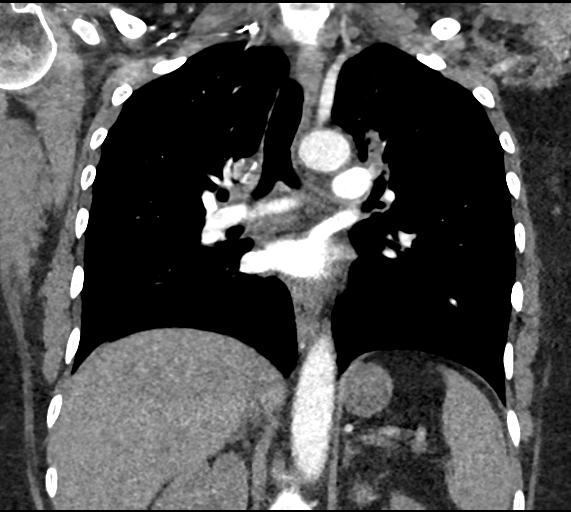
[im 91/121  soft-tissue]
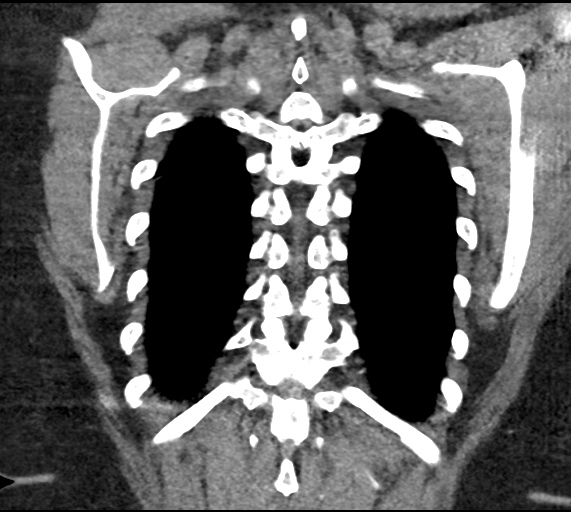

[18 of 46 positions shown; findings below may reference images not displayed]

FINDINGS: CARDIOVASCULAR: Adequate contrast opacification of the pulmonary
artery's. Main pulmonary artery is not enlarged. No pulmonary
arterial filling defects to the level of the segmental branches.
Mild respiratory motion, limits sensitivity for subsegmental emboli.
Heart size is normal, no right heart strain. No pericardial
effusion. Thoracic aorta is normal course and caliber, mild calcific
atherosclerosis aortic arch.

MEDIASTINUM/NODES: 10 mm short axis aortopulmonary window lymph
node. 14 mm subcarinal lymph node. Anterior mediastinal fat
stranding most compatible with thymic reactivation.

LUNGS/PLEURA: Tracheobronchial tree is patent, no pneumothorax. Mild
bronchial wall thickening. LEFT upper lobe nodular scarring with
bronchiectasis, 4 mm dense versus enhancing nodular component. RIGHT
lung base atelectasis or scarring.

UPPER ABDOMEN: Nonacute.  Status post cholecystectomy.

MUSCULOSKELETAL: Visualized soft tissues and included osseous
structures appear normal.

Review of the MIP images confirms the above findings.
IMPRESSION: 1. No acute pulmonary embolism.
2. Bronchial wall thickening seen with bronchitis or reactive airway
disease without focal consolidation.
3. LEFT upper lobe scarring suggests post infectious in etiology.
4. Mild mediastinal lymphadenopathy, associated with sarcoidosis.

## 2021-05-17 DEATH — deceased
